# Patient Record
Sex: Female | Born: 2000 | Race: Black or African American | Hispanic: No | Marital: Single | Smoking: Never smoker
Health system: Southern US, Community
[De-identification: ages and names within clinical notes are randomized; demographics above are authoritative.]

## PROBLEM LIST (undated history)

## (undated) DIAGNOSIS — E119 Type 2 diabetes mellitus without complications: Secondary | ICD-10-CM

---
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
## 2022-08-31 IMAGING — CT CT ABD-PELV W/ CM
2 of 4 series · 16 of 46 positions shown, 18 images · IV contrast (Omnipaque)
Comparison: None.

CLINICAL DATA: Right lower quadrant/right flank pain for 5 days.
Urinary frequency.

EXAM:
CT ABDOMEN AND PELVIS WITH CONTRAST
TECHNIQUE: Multidetector CT imaging of the abdomen and pelvis was performed
using the standard protocol following bolus administration of
intravenous contrast.
CONTRAST:  100mL OMNIPAQUE IOHEXOL 300 MG/ML  SOLN

[Series 2: axial st · axial · 0.90mm/px · z∈[+504,+918]mm · 13 of 91 slices shown, 15 images]
[im 4/91  soft-tissue]
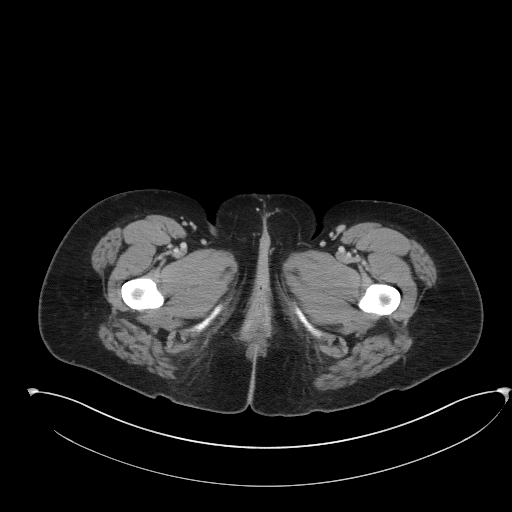
[im 4/91  bone]
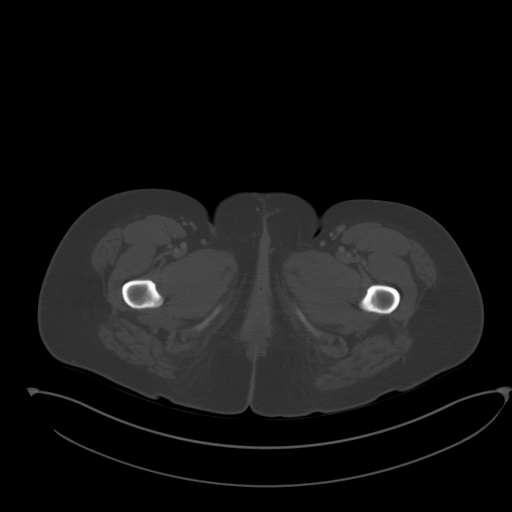
[im 11/91  soft-tissue]
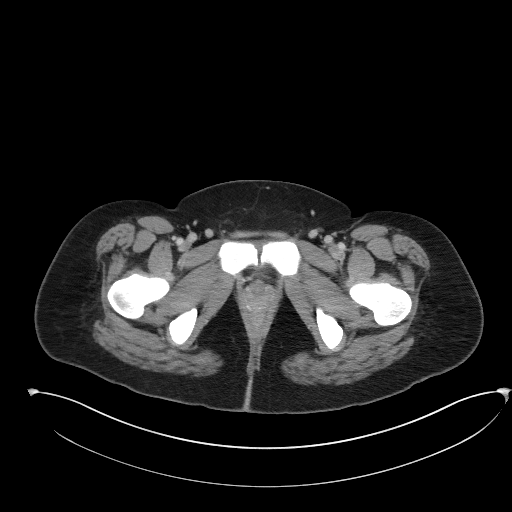
[im 19/91  soft-tissue]
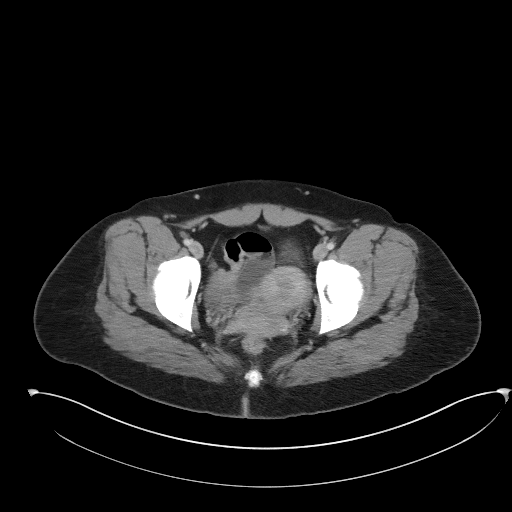
[im 26/91  soft-tissue]
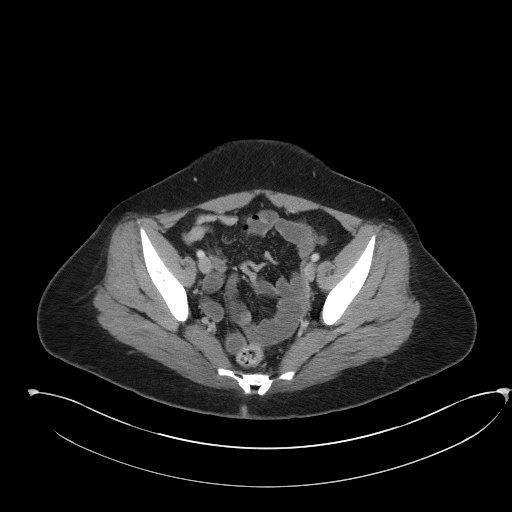
[im 33/91  soft-tissue]
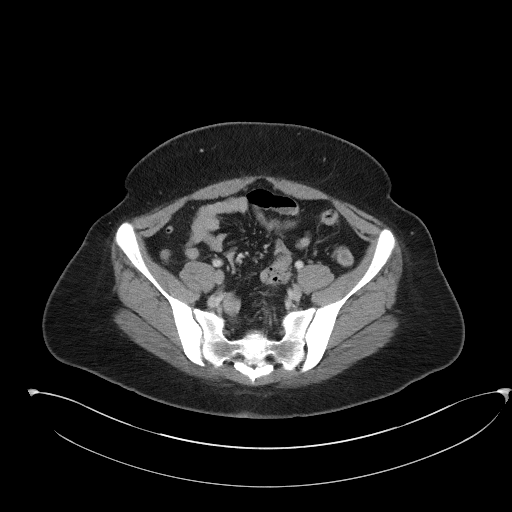
[im 40/91  soft-tissue]
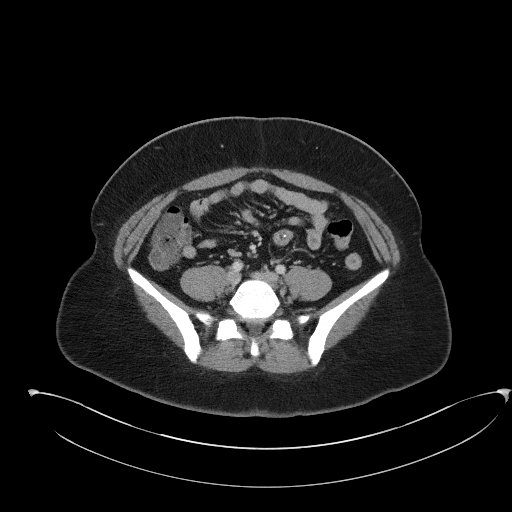
[im 47/91  soft-tissue]
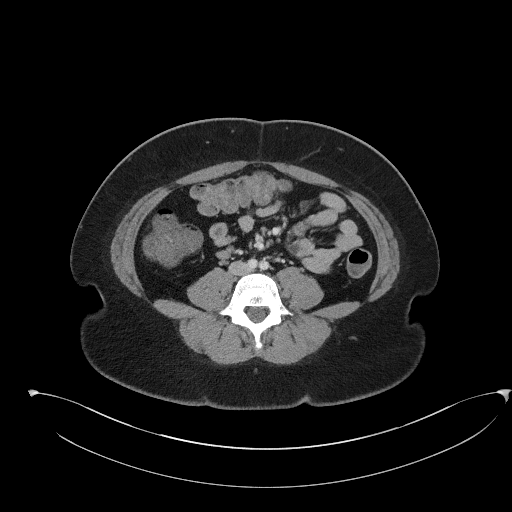
[im 51/91  soft-tissue]
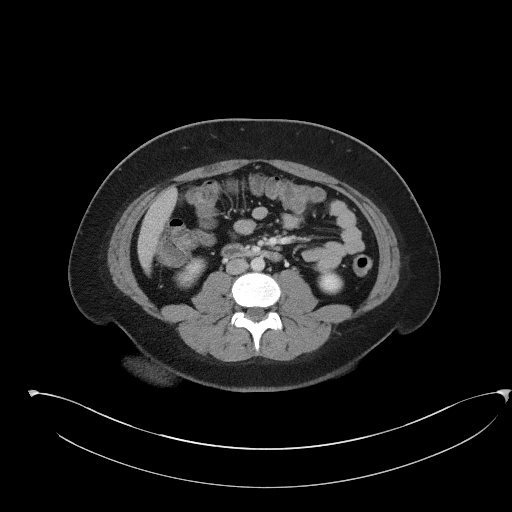
[im 58/91  soft-tissue]
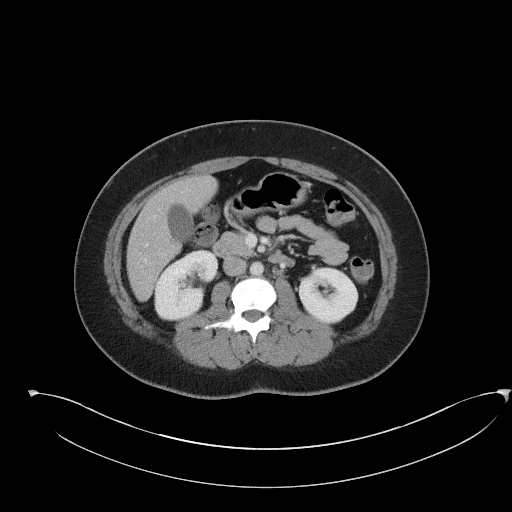
[im 58/91  bone]
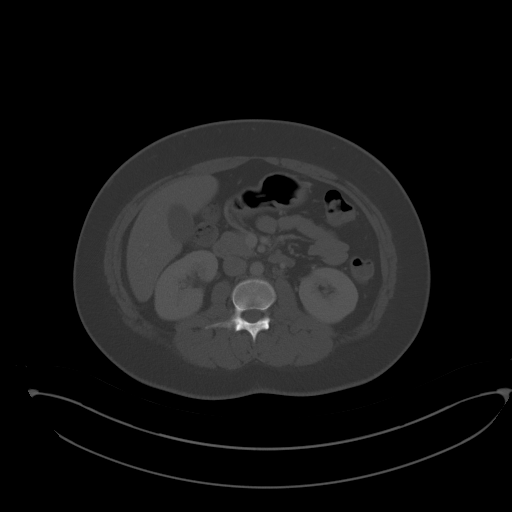
[im 65/91  soft-tissue]
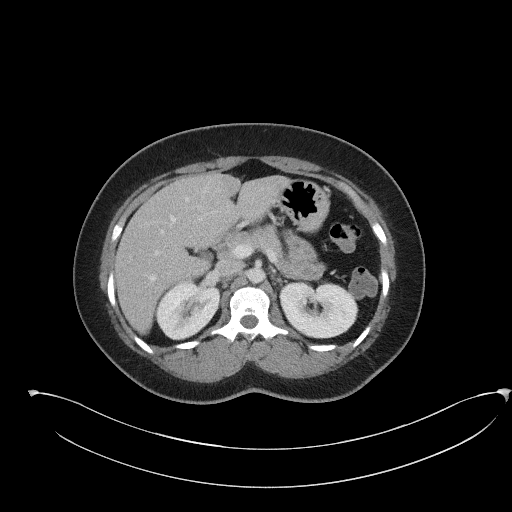
[im 73/91  soft-tissue]
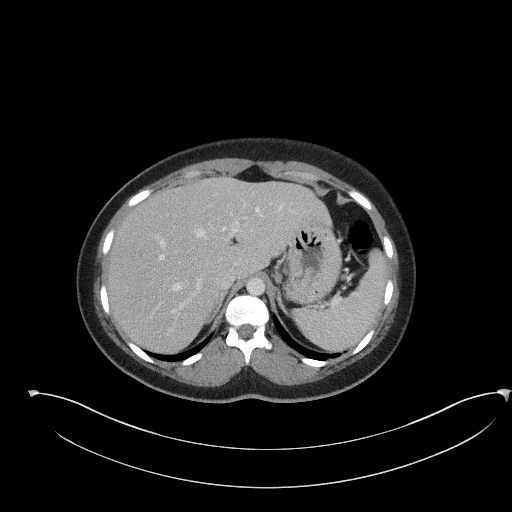
[im 80/91  soft-tissue]
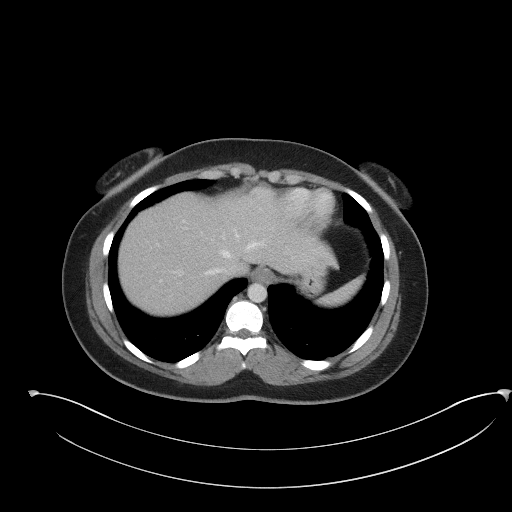
[im 87/91  soft-tissue]
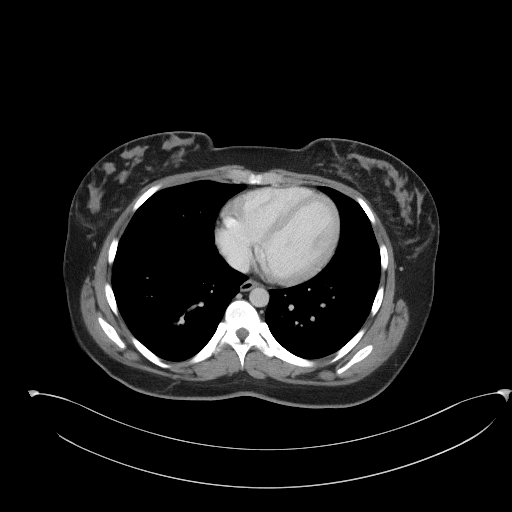

[Series 5: coronal st · coronal · 0.67mm/px · 3 of 97 slices shown]
[im 33/97  soft-tissue]
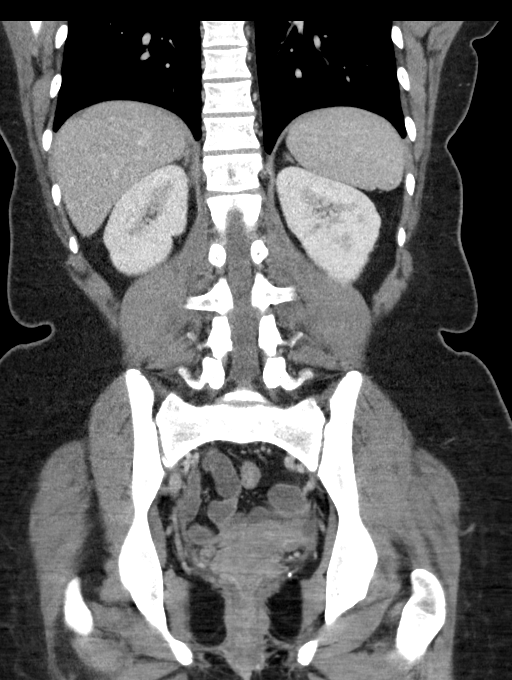
[im 43/97  soft-tissue]
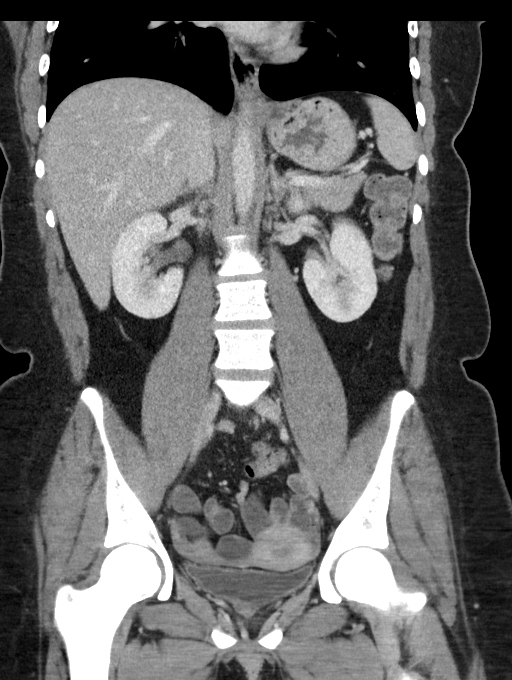
[im 54/97  soft-tissue]
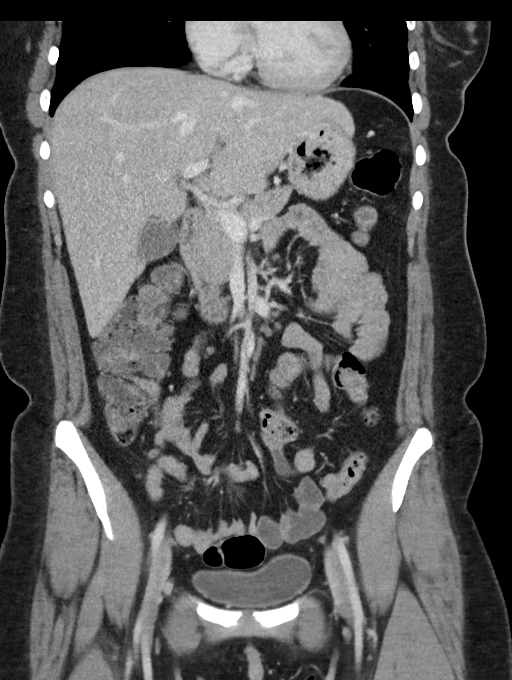

[16 of 46 positions shown; findings below may reference images not displayed]

FINDINGS: Lower chest: Unremarkable

Hepatobiliary: Unremarkable

Pancreas: Unremarkable

Spleen: Unremarkable

Adrenals/Urinary Tract: Nonspecific 0.8 cm hypodense lesion in the
right kidney upper pole on image 40 of series 5, questionable faint
stranding in the adjacent perirenal adipose tissue. No urinary tract
calculi. No hydronephrosis. Left kidney appears normal. Urinary
bladder unremarkable.

Stomach/Bowel: Unremarkable.  Normal appendix.

Vascular/Lymphatic: Unremarkable

Reproductive: Unremarkable

Other: No supplemental non-categorized findings.

Musculoskeletal: Unremarkable
IMPRESSION: 1. Nonspecific small hypodense focus in the right kidney upper pole
with a trace amount of adjacent perirenal stranding. The finding is
subtle and could well be due to a small renal cyst, but in the
setting of right flank pain and other lab indicators of potential
urinary tract infection, the possibility of a small focus of
infection/pyelonephritis is difficult to totally exclude.
